# Patient Record
Sex: Male | Born: 1960 | Race: White | Hispanic: No | Marital: Single | Smoking: Never smoker
Health system: Southern US, Community
[De-identification: ages and names within clinical notes are randomized; demographics above are authoritative.]

## PROBLEM LIST (undated history)

## (undated) DIAGNOSIS — E800 Hereditary erythropoietic porphyria: Secondary | ICD-10-CM

## (undated) DIAGNOSIS — C140 Malignant neoplasm of pharynx, unspecified: Secondary | ICD-10-CM

## (undated) DIAGNOSIS — A770 Spotted fever due to Rickettsia rickettsii: Secondary | ICD-10-CM

## (undated) HISTORY — PX: THROAT SURGERY: SHX803

## (undated) HISTORY — PX: NECK DISSECTION: SUR422

## (undated) HISTORY — PX: CHOLECYSTECTOMY: SHX55

## (undated) HISTORY — PX: VASECTOMY: SHX75

## (undated) HISTORY — PX: KNEE SURGERY: SHX244

---

## 2020-01-26 ENCOUNTER — Emergency Department (HOSPITAL_BASED_OUTPATIENT_CLINIC_OR_DEPARTMENT_OTHER)
Admission: EM | Admit: 2020-01-26 | Discharge: 2020-01-26 | Disposition: A | Discharge status: Home / Self Care | Payer: 59 | Attending: Emergency Medicine | Admitting: Emergency Medicine

## 2020-01-26 ENCOUNTER — Encounter (HOSPITAL_BASED_OUTPATIENT_CLINIC_OR_DEPARTMENT_OTHER): Payer: Self-pay | Admitting: Emergency Medicine

## 2020-01-26 ENCOUNTER — Other Ambulatory Visit: Payer: Self-pay

## 2020-01-26 ENCOUNTER — Emergency Department (HOSPITAL_BASED_OUTPATIENT_CLINIC_OR_DEPARTMENT_OTHER): Payer: 59

## 2020-01-26 DIAGNOSIS — Z23 Encounter for immunization: Secondary | ICD-10-CM | POA: Diagnosis not present

## 2020-01-26 DIAGNOSIS — L03116 Cellulitis of left lower limb: Secondary | ICD-10-CM | POA: Insufficient documentation

## 2020-01-26 DIAGNOSIS — Z4800 Encounter for change or removal of nonsurgical wound dressing: Secondary | ICD-10-CM | POA: Diagnosis present

## 2020-01-26 DIAGNOSIS — Z91010 Allergy to peanuts: Secondary | ICD-10-CM | POA: Diagnosis not present

## 2020-01-26 DIAGNOSIS — L089 Local infection of the skin and subcutaneous tissue, unspecified: Secondary | ICD-10-CM | POA: Diagnosis not present

## 2020-01-26 HISTORY — DX: Hereditary erythropoietic porphyria: E80.0

## 2020-01-26 HISTORY — DX: Spotted fever due to Rickettsia rickettsii: A77.0

## 2020-01-26 HISTORY — DX: Malignant neoplasm of pharynx, unspecified: C14.0

## 2020-01-26 MED ORDER — CEPHALEXIN 500 MG PO CAPS
500.0000 mg | ORAL_CAPSULE | Freq: Four times a day (QID) | ORAL | 0 refills | Status: AC
Start: 2020-01-26 — End: 2020-02-02

## 2020-01-26 MED ORDER — TETANUS-DIPHTH-ACELL PERTUSSIS 5-2.5-18.5 LF-MCG/0.5 IM SUSP
0.5000 mL | Freq: Once | INTRAMUSCULAR | Status: AC
  Administered 2020-01-26: 0.5 mL via INTRAMUSCULAR
  Filled 2020-01-26: qty 0.5

## 2020-01-26 MED ORDER — CLINDAMYCIN HCL 150 MG PO CAPS
450.0000 mg | ORAL_CAPSULE | Freq: Three times a day (TID) | ORAL | 0 refills | Status: DC
Start: 2020-01-26 — End: 2020-01-26

## 2020-01-26 NOTE — ED Triage Notes (Signed)
Pt injured left ankle one week ago.  Pt had storm door hit left outer ankle.  Pt has scab in place but noted some redness and swelling.  Pt states he has tried OTC antibiotic ointment but doesn't seem to be helping.

## 2020-01-26 NOTE — Discharge Instructions (Addendum)
At this time there does not appear to be the presence of an emergent medical condition, however there is always the potential for conditions to change. Please read and follow the below instructions.  Please return to the Emergency Department immediately for any new or worsening symptoms. See your primary care doctor in 3-4 days for recheck of your foot.  If you are unable to see your primary care doctor in 3 or 4 days then return to the ER for recheck.  Return to the ER immediately if you develop any new or worsening symptoms. Please take your antibiotic Keflex as prescribed until complete to help with your symptoms.  Please drink enough water to avoid dehydration and get plenty of rest.  Get help right away if: You feel very sleepy. You throw up (vomit) or have watery poop (diarrhea) for a long time. You see red streaks coming from the area. Your red area gets larger. Your red area turns dark in color. You have fever or chills You have any new/concerning or worsening symptoms.   Please read the additional information packets attached to your discharge summary.  Do not take your medicine if  develop an itchy rash, swelling in your mouth or lips, or difficulty breathing; call 911 and seek immediate emergency medical attention if this occurs.  You may review your lab tests and imaging results in their entirety on your MyChart account.  Please discuss all results of fully with your primary care provider and other specialist at your follow-up visit.  Note: Portions of this text may have been transcribed using voice recognition software. Every effort was made to ensure accuracy; however, inadvertent computerized transcription errors may still be present.

## 2020-01-26 NOTE — ED Provider Notes (Addendum)
[name] Provider Note   CSN: [id number] Arrival date & time: 01/26/20  1062     History Chief Complaint  Patient presents with  . Wound Check    Samuel [name] is [name] 59 y.o. 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  Patient presents today for injury of his left foot.  1 week ago his dog ran into his metal storm door causing it to strike his foot, he suffered [name] puncture wound of the left lateral foot.  He had immediate pain and bleeding of the area, he cleaned the area with hydrogen peroxide and water and has been applying antibiotic ointment and bandages every day and attempt to keep it clean.  He reports that he had immediate sharp moderate intensity pain of the area that did not radiate no aggravating or alleviating factors.  Pain is gradually improved since that time.  Over the last 2 days he has noticed redness and swelling to the area.  Denies fever/chills, nausea/vomiting, abdominal pain, numbness/tingling, weakness, drainage, joint pain/swelling or any additional concerns. HPI     Past Medical History:  Diagnosis Date  . 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 Start Date End Date Taking? Authorizing Provider  Cholecalciferol 25 MCG (1000 UT) tablet Take by mouth. 11/01/14  Yes Provider, Historical, [name]  valACYclovir (VALTREX) 1000 MG tablet Take by mouth. 08/21/19  Yes Provider, Historical, [name]  cephALEXin (KEFLEX) 500 MG capsule Take 1 capsule (500 mg total) by mouth 4 (four)  times daily for 7 days. 01/26/20 02/02/20  Nuala Alpha [name], [name]  pravastatin (PRAVACHOL) 20 MG tablet Take 20 mg by mouth at bedtime. 01/06/20   Provider, Historical, [name]  sertraline (ZOLOFT) 100 MG tablet Take 150 mg by mouth daily. 01/09/20   Provider, Historical, [name]    Allergies    Levofloxacin, Peanut-containing drug products, Sulfa antibiotics, Sulfamethoxazole-trimethoprim, Fenofibrate, Flucloxacillin, and Tape  Review of Systems   Review of Systems  Constitutional: Negative.  Negative for chills and fever.  Gastrointestinal: Negative.  Negative for abdominal pain, nausea and vomiting.  Musculoskeletal: Negative for arthralgias and myalgias.  Skin: Positive for wound.  Neurological: Negative.  Negative for weakness and numbness.    Physical Exam Updated Vital Signs BP (!) 140/80 (BP Location: Right Arm)   Pulse 56   Temp 97.9 F (36.6 C) (Oral)   Resp 16   Ht 5\' 10"  (1.778 m)   Wt 86.2 kg   SpO2 99%   BMI 27.26 kg/m   Physical Exam Constitutional:      General: He is not in acute distress.    Appearance: Normal appearance. He is well-developed. He is not ill-appearing or diaphoretic.  HENT:     Head: Normocephalic and atraumatic.  Eyes:     General: Vision grossly intact. Gaze aligned appropriately.     Pupils: Pupils are equal, round, and reactive to light.  Neck:     Trachea: Trachea and phonation  normal.  Cardiovascular:     Pulses:          Dorsalis pedis pulses are 2+ on the right side and 2+ on the left side.  Pulmonary:     Effort: Pulmonary effort is normal. No respiratory distress.  Abdominal:     General: There is no distension.     Palpations: Abdomen is soft.     Tenderness: There is no abdominal tenderness. There is no guarding or rebound.  Musculoskeletal:        General: Normal range of motion.     Cervical back: Normal range of motion.       Feet:  Feet:     Right foot:     Protective Sensation: 5 sites tested. 5 sites sensed.     Left  foot:     Protective Sensation: 5 sites tested. 5 sites sensed.     Comments: 1 cm diameter scab present with [name] surrounding 2 cm area of erythema without streaking.  No fluctuance induration or drainage.  See picture below.  No pain with range of motion at the toes, ankle or knees.  No significant swelling present.  Strong equal pedal pulses.  Capillary refill and sensation intact to all toes.  Compartments soft. Skin:    General: Skin is warm and dry.  Neurological:     Mental Status: He is alert.     GCS: GCS eye subscore is 4. GCS verbal subscore is 5. GCS motor subscore is 6.     Comments: Speech is clear and goal oriented, follows commands Major Cranial nerves without deficit, no facial droop Moves extremities without ataxia, coordination intact  Psychiatric:        Behavior: Behavior normal.       ED Results / Procedures / Treatments   Labs (all labs ordered are listed, but only abnormal results are displayed) Labs Reviewed - No data to display  EKG None  Radiology DG Foot Complete Left  Result Date: 01/26/2020 CLINICAL DATA:  Wound below LEFT lateral malleolus at heel, puncture wound to lateral heel on [name] storm door last Friday EXAM: LEFT FOOT - COMPLETE 3+ VIEW COMPARISON:  None FINDINGS: Osseous mineralization normal. Joint spaces preserved. No fracture, dislocation, or bone destruction. No soft tissue gas identified. IMPRESSION: Normal exam. Electronically Signed   By: Samuel Dana M.D.   On: 01/26/2020 11:05    Procedures Procedures (including critical care time)  Medications Ordered in ED Medications  Tdap (BOOSTRIX) injection 0.5 mL (0.5 mLs Intramuscular Given 01/26/20 1058)    ED Course  I have reviewed the triage vital signs and the nursing notes.  Pertinent labs & imaging results that were available during my care of the patient were reviewed by me and considered in my medical decision making (see chart for details).    MDM Rules/Calculators/[name]&P                           Additional history obtained from: 1. Nursing notes from this visit. -----  DG Left Foot:    IMPRESSION:  Normal exam.  I have personally reviewed patient's left foot x-ray and agree with radiologist interpretation above.  No evidence of foreign body.  59 year old male history as detailed above presents today with [name] wound infection of the left foot appears to be developing [name] mild cellulitis around the wound.  No evidence of foreign body.  Neurovascularly intact no evidence of septic arthritis, compartment syndrome, neurovascular  compromise, DVT or other emergent pathologies.  Will start patient on clindamycin 450 mg 3 times daily x7 days and have him follow-up for [name] recheck in 3-4 days either with his PCP or back here at the emergency department.  We discussed signs and symptoms of worsening infection and patient states understanding to return to the emergency room immediately if they occur.  Patient is nontoxic-appearing and in no acute distress states understanding care plan and is agreeable for discharge.  Vital signs stable without fever, tachycardia, tachypnea or hypotension.  At this time there does not appear to be any evidence of an acute emergency medical condition and the patient appears stable for discharge with appropriate outpatient follow up. Diagnosis was discussed with patient who verbalizes understanding of care plan and is agreeable to discharge. I have discussed return precautions with patient who verbalizes understanding. Patient encouraged to follow-up with their PCP. All questions answered.  Patient's case discussed with Dr. Johnney [name] who agrees with plan to discharge with Clindamycin and  follow-up.  ------------- Addendum patient reports clindamycin noncompatible with porphyria.  I searched the up-to-date website found no contraindications however patient is very concerned.  Will change to Keflex, discussed with Dr. Johnney [name] who agrees.  Note: Portions of this  report may have been transcribed using voice recognition software. Every effort was made to ensure accuracy; however, inadvertent computerized transcription errors may still be present. Final Clinical Impression(s) / ED Diagnoses Final diagnoses:  Cellulitis of left lower extremity  Wound infection    Rx / DC Orders ED Discharge Orders         Ordered    clindamycin (CLEOCIN) 150 MG capsule  3 times daily,   Status:  Discontinued     Reprint     01/26/20 1147    cephALEXin (KEFLEX) 500 MG capsule  4 times daily     Discontinue  Reprint     01/26/20 May, Samuel Leeth [name], [name] 01/26/20 1154    Gari Crown 01/26/20 1233    Samuel Shanks, Samuel [name] 02/04/20 [phone]

## 2020-02-26 ENCOUNTER — Ambulatory Visit
Admission: RE | Admit: 2020-02-26 | Discharge: 2020-02-26 | Disposition: A | Discharge status: Home / Self Care | Payer: Worker's Compensation | Source: Ambulatory Visit | Attending: Nurse Practitioner | Admitting: Nurse Practitioner

## 2020-02-26 ENCOUNTER — Other Ambulatory Visit: Payer: Self-pay

## 2020-02-26 ENCOUNTER — Other Ambulatory Visit: Payer: Self-pay | Admitting: Nurse Practitioner

## 2020-02-26 DIAGNOSIS — M25462 Effusion, left knee: Secondary | ICD-10-CM

## 2020-02-26 DIAGNOSIS — M25562 Pain in left knee: Secondary | ICD-10-CM

## 2020-08-01 ENCOUNTER — Ambulatory Visit
Admission: RE | Admit: 2020-08-01 | Discharge: 2020-08-01 | Disposition: A | Discharge status: Home / Self Care | Payer: No Typology Code available for payment source | Source: Ambulatory Visit | Attending: Nurse Practitioner | Admitting: Nurse Practitioner

## 2020-08-01 ENCOUNTER — Other Ambulatory Visit: Payer: Self-pay | Admitting: Nurse Practitioner

## 2020-08-01 DIAGNOSIS — Z0289 Encounter for other administrative examinations: Secondary | ICD-10-CM

## 2021-03-03 IMAGING — CR DG KNEE COMPLETE 4+V*L*
4 series · 4 of 4 positions shown · non-contrast
Comparison: None.

CLINICAL DATA: Left knee hyperextension, pain

EXAM:
LEFT KNEE - COMPLETE 4+ VIEW

[w knee ap left]
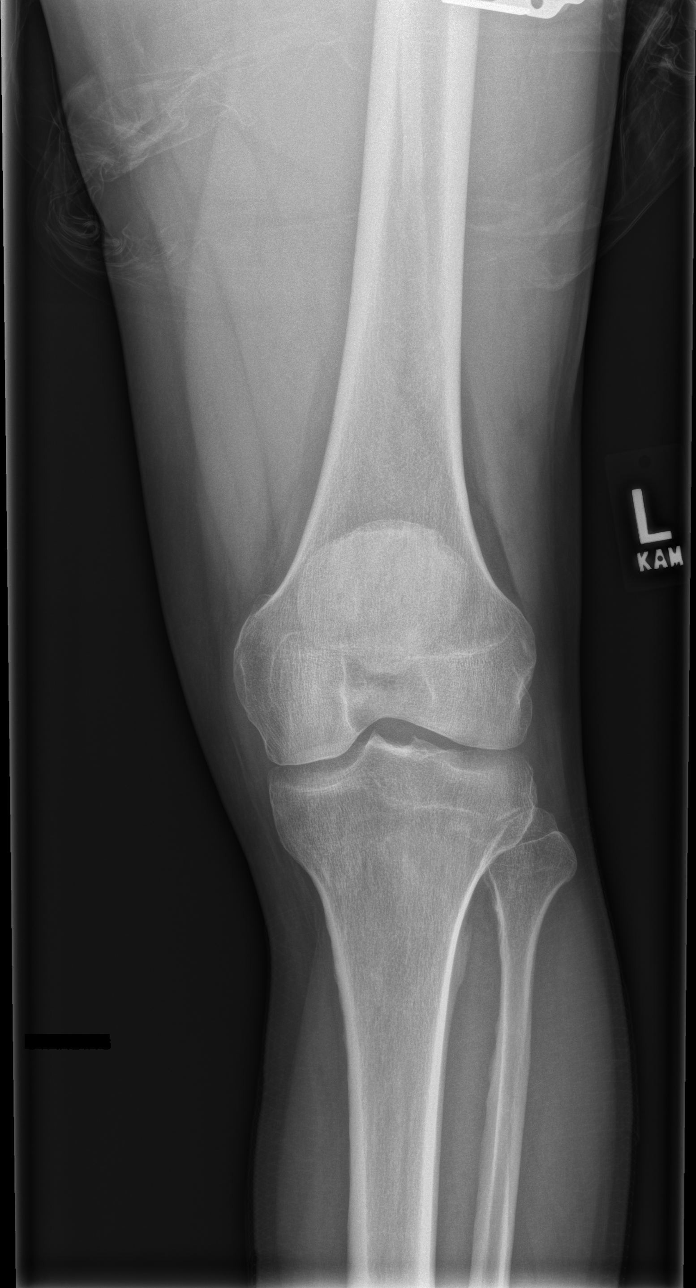

[w knee lat left]
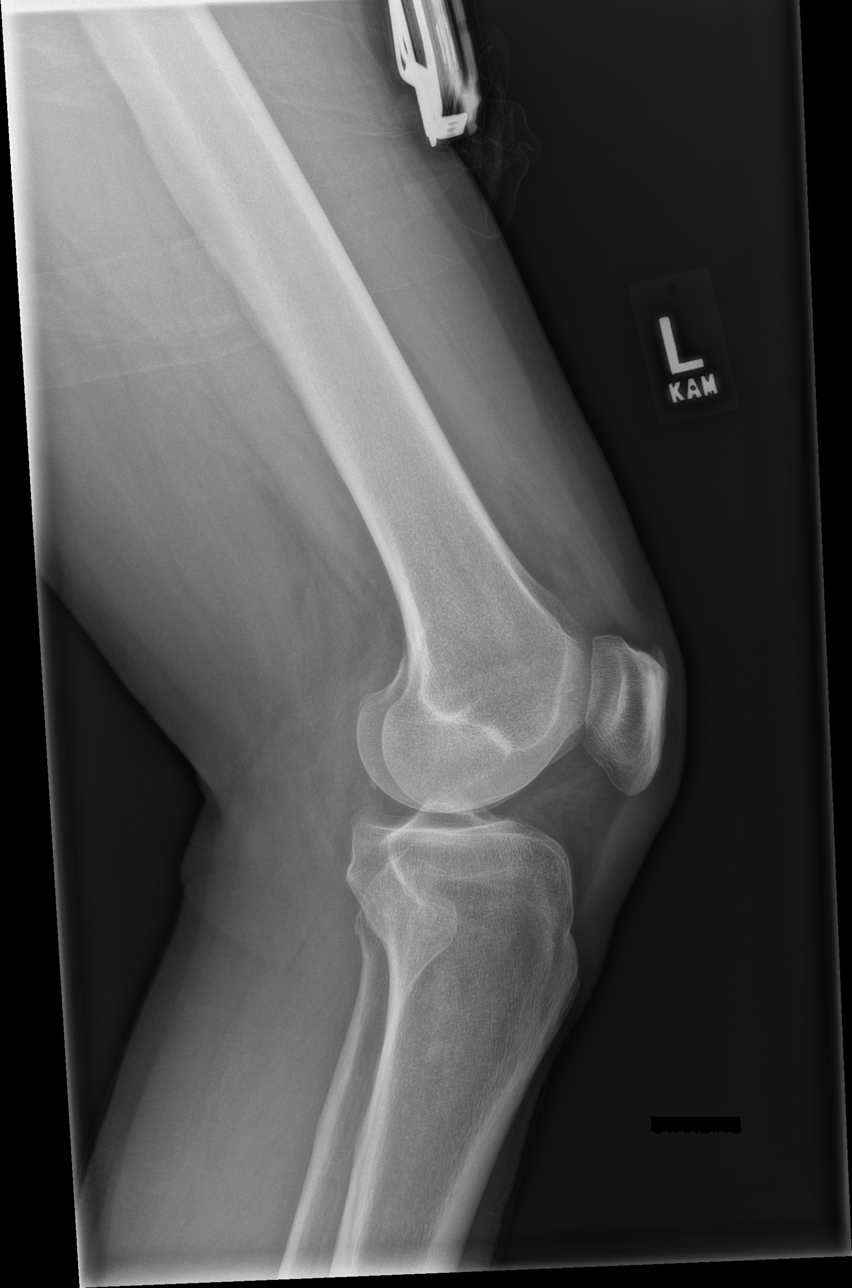

[w knee tunnel pa left]
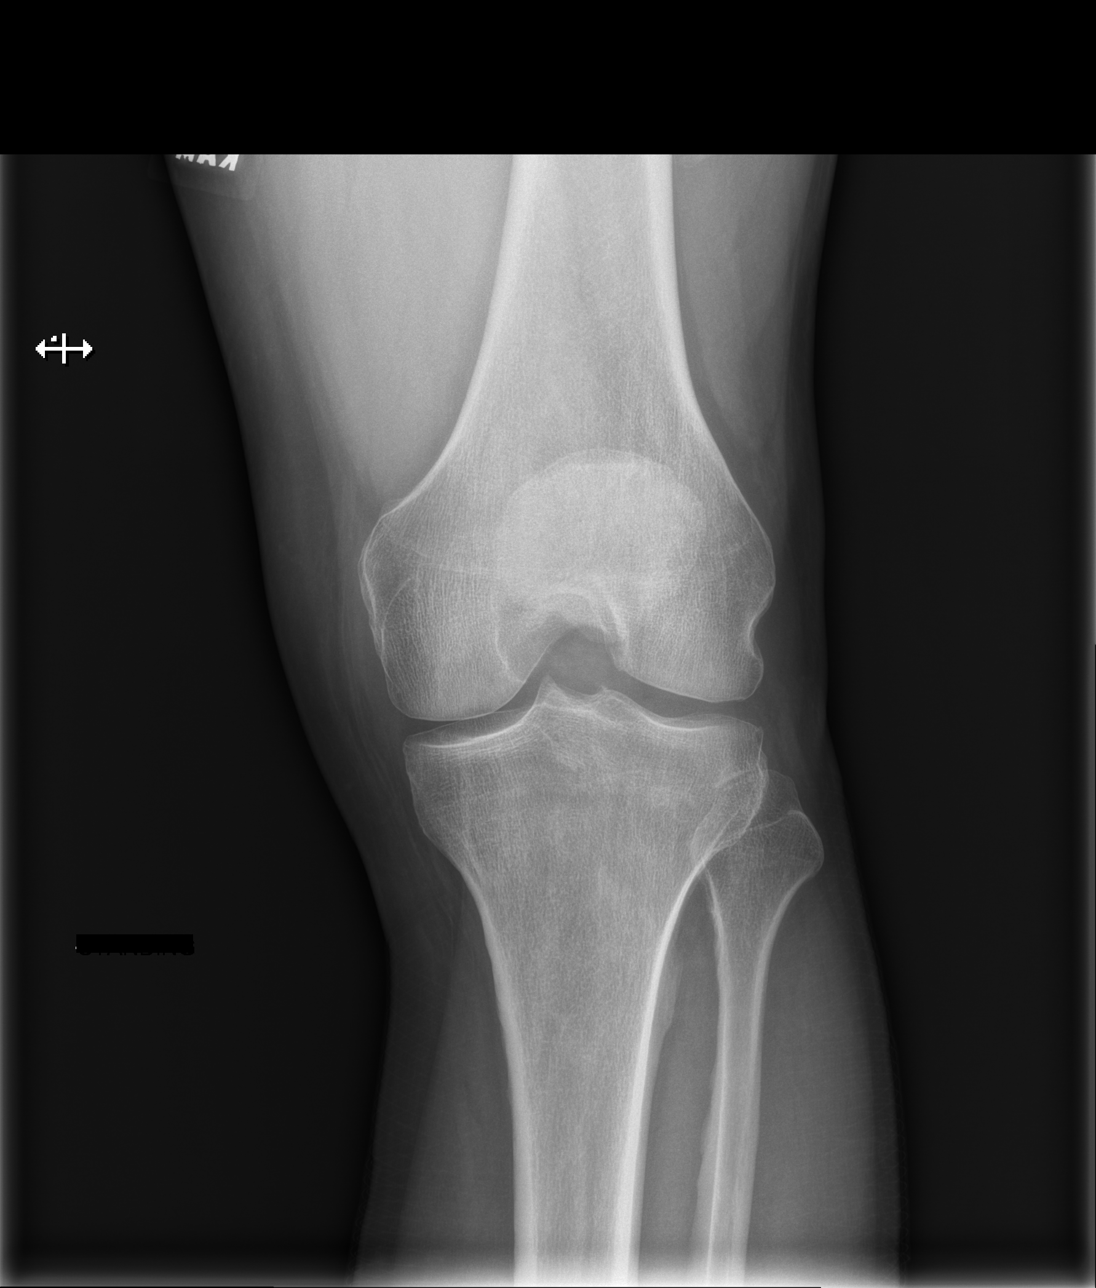

[x knee sunrise left]
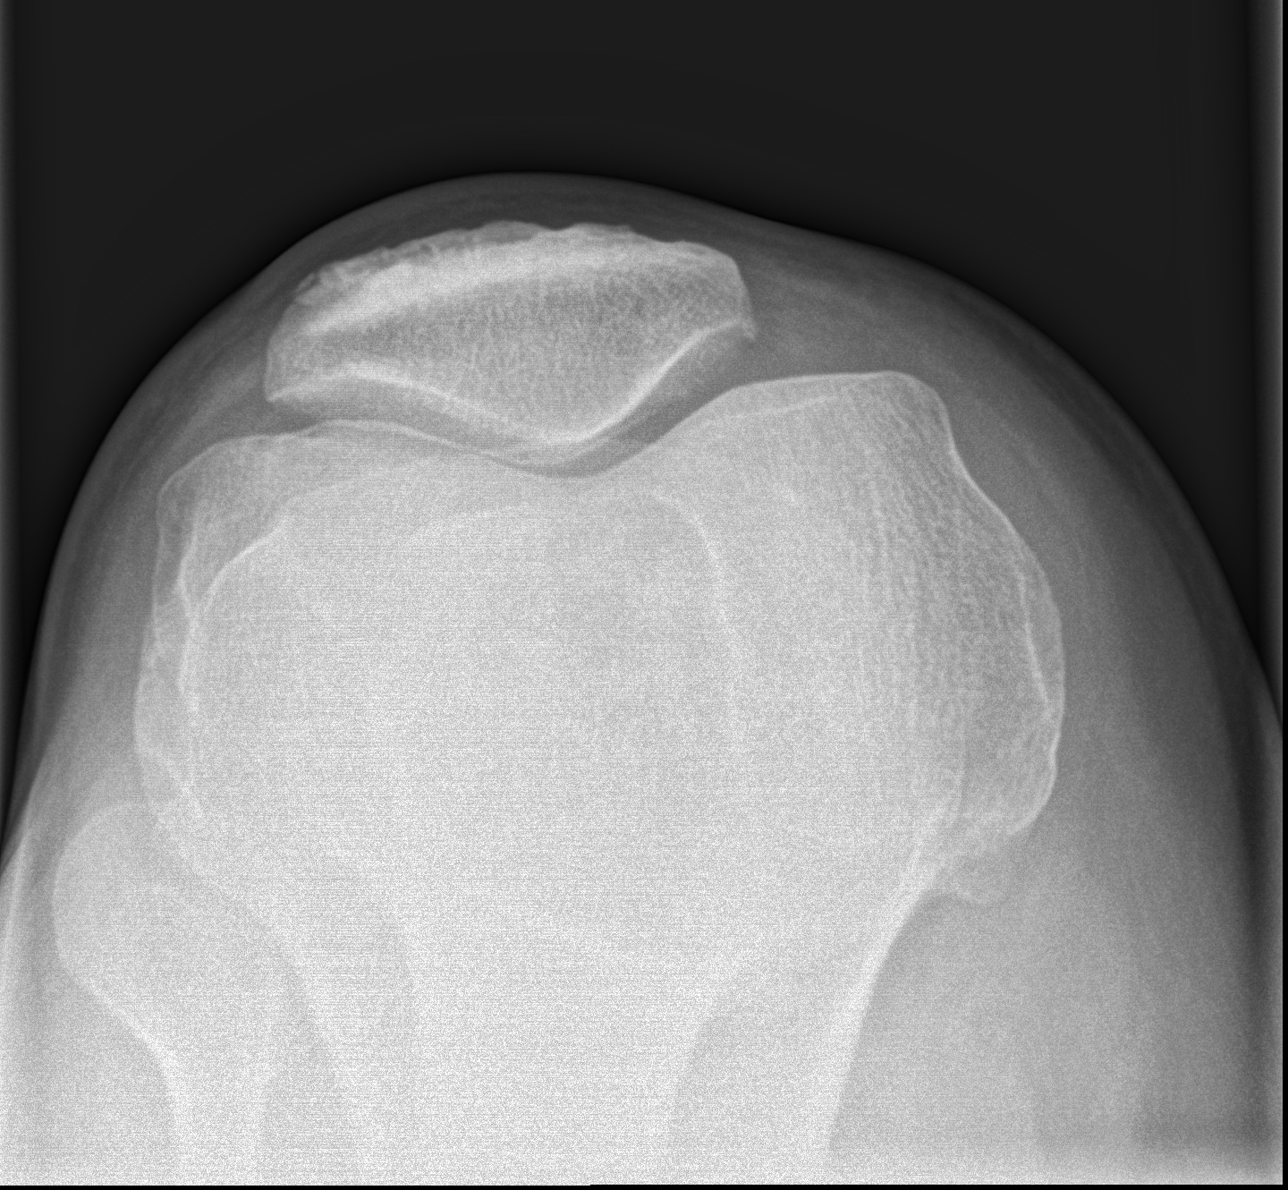

[4 of 4 positions shown; findings below may reference images not displayed]

FINDINGS: Four view radiograph left knee demonstrates normal alignment. No
fracture or dislocation. Joint spaces are preserved. Trace left knee
effusion. Soft tissues are unremarkable.
IMPRESSION: Trace left knee effusion. No acute bony abnormality.

## 2021-08-07 IMAGING — CR DG CHEST 2V
2 series · 2 of 2 positions shown · non-contrast
Comparison: March 01, 2017

CLINICAL DATA: Physical

EXAM:
CHEST - 2 VIEW

[w chest pa]
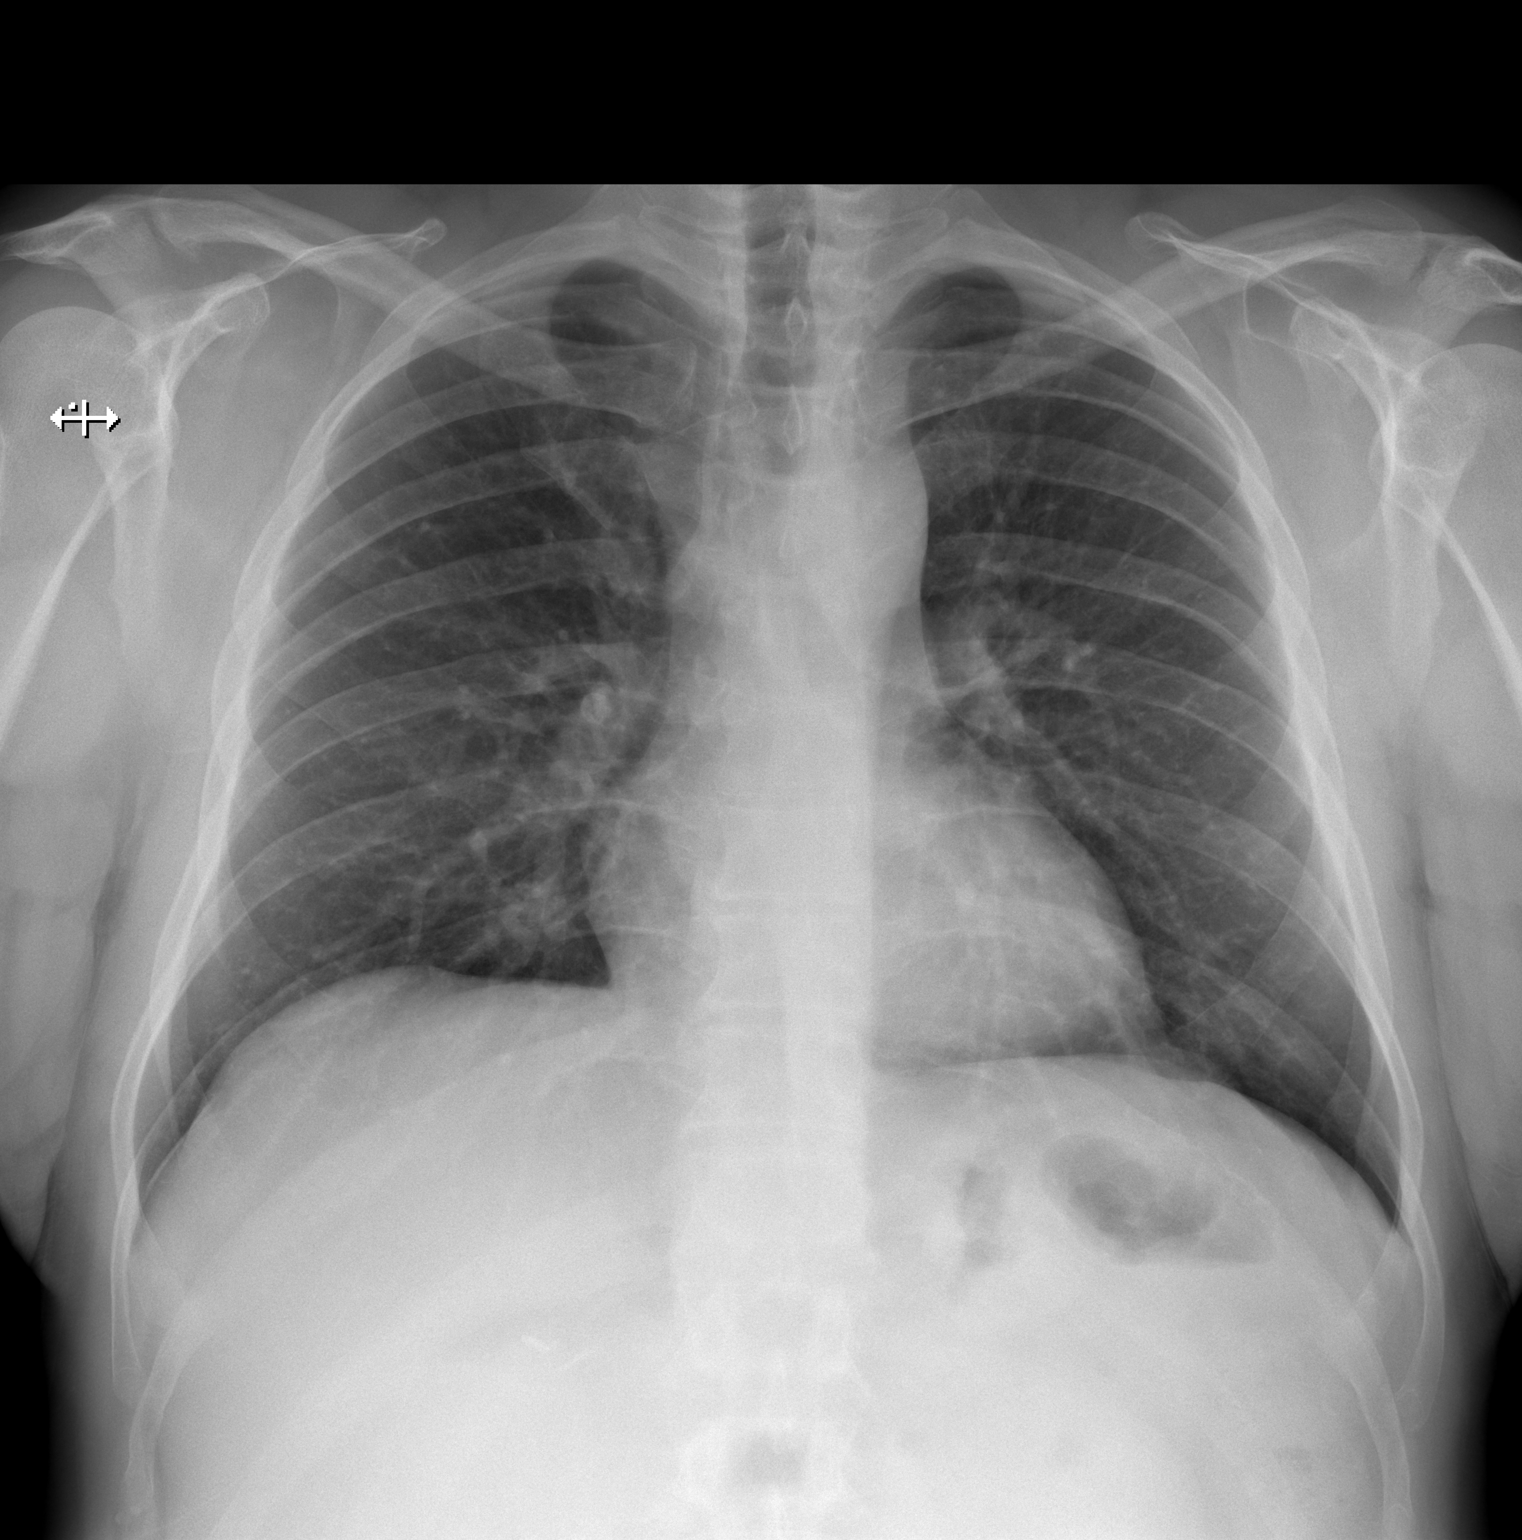

[w chest lat]
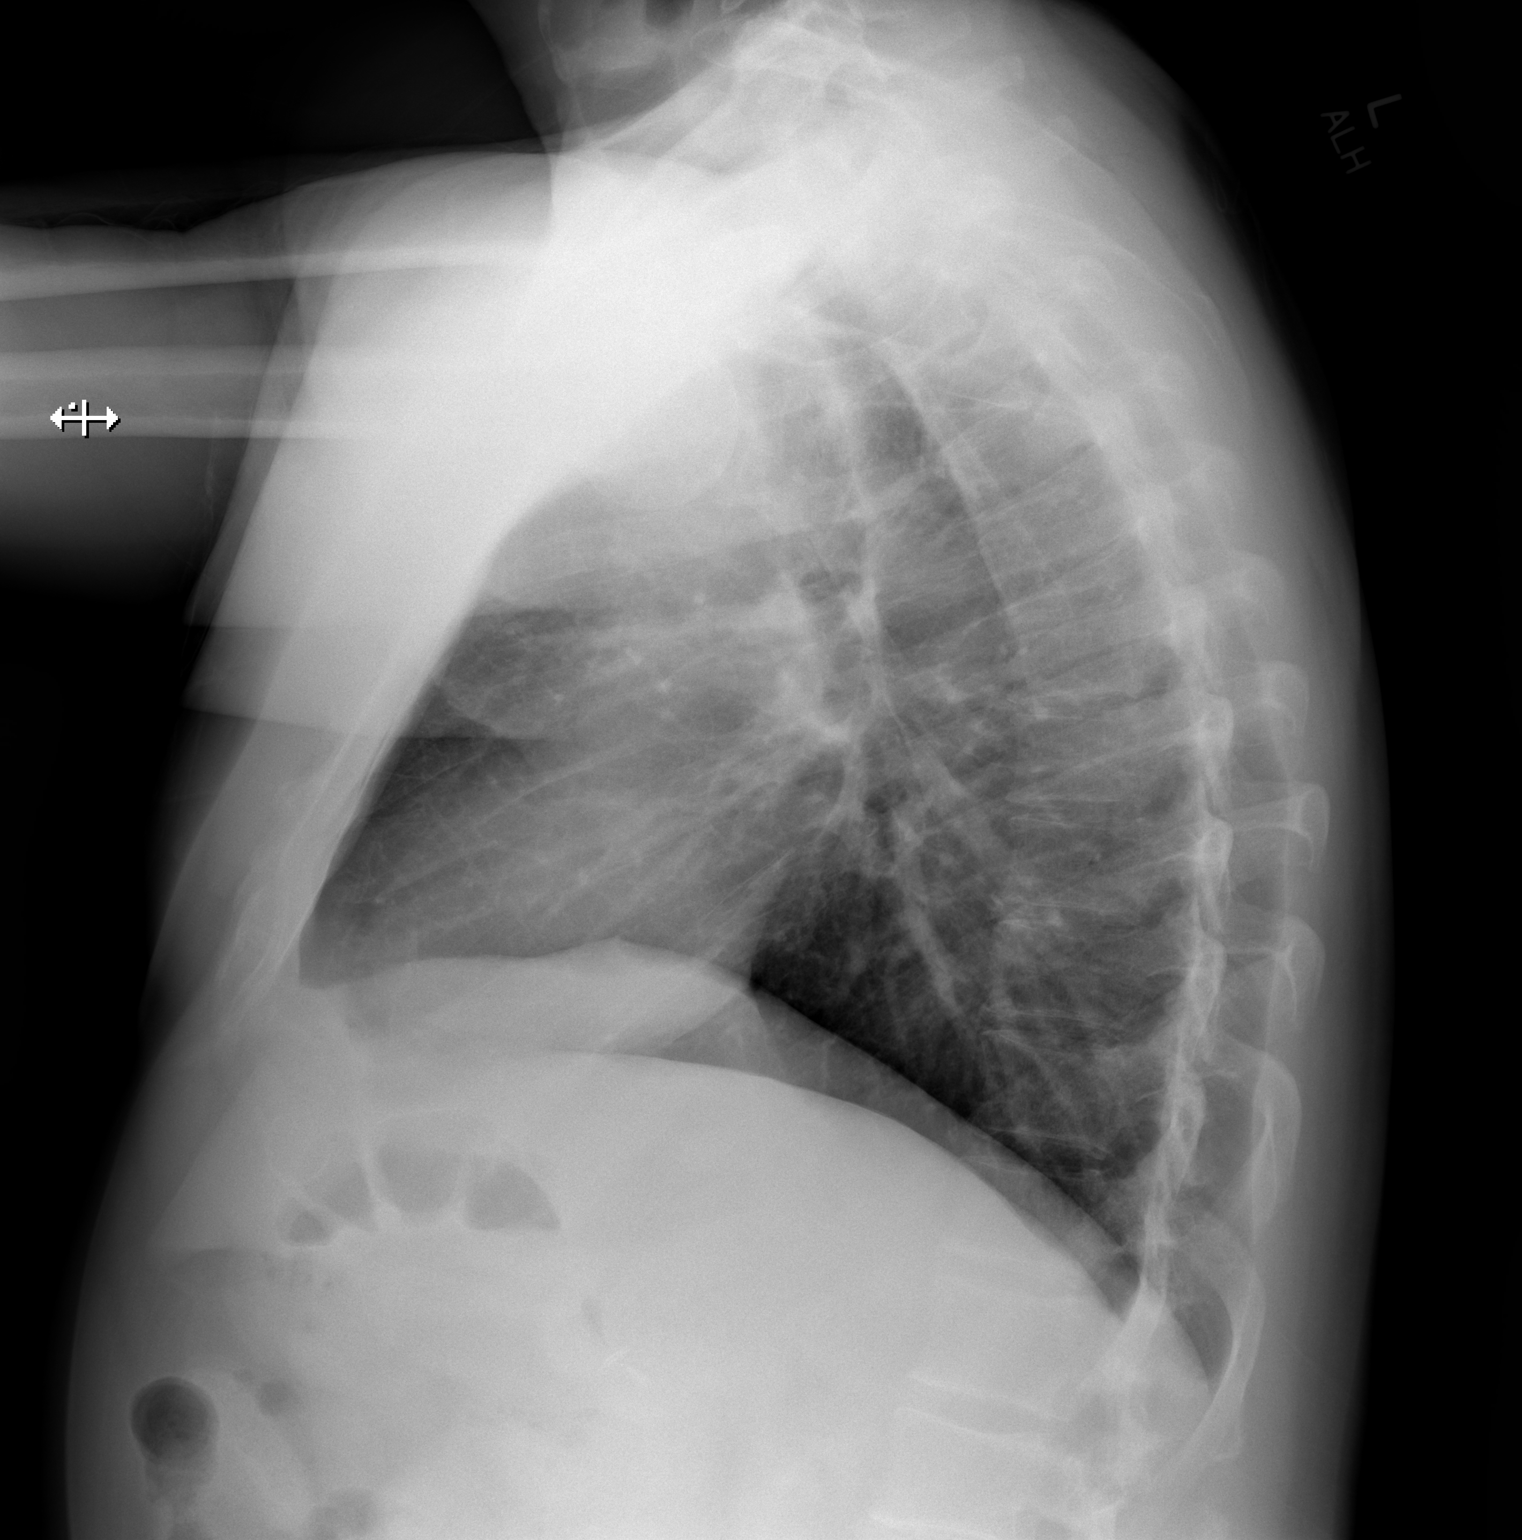

[2 of 2 positions shown; findings below may reference images not displayed]

FINDINGS: The cardiomediastinal silhouette is unchanged in contour. No pleural
effusion. No pneumothorax. No acute pleuroparenchymal abnormality.
Status post cholecystectomy. No acute osseous abnormality.
IMPRESSION: No acute cardiopulmonary abnormality.
